# Patient Record
Sex: Female | Born: 1980 | Race: White | Hispanic: Yes | Marital: Married | Smoking: Never smoker
Health system: Southern US, Community
[De-identification: ages and names within clinical notes are randomized; demographics above are authoritative.]

## PROBLEM LIST (undated history)

## (undated) DIAGNOSIS — Z789 Other specified health status: Secondary | ICD-10-CM

## (undated) HISTORY — PX: NO PAST SURGERIES: SHX2092

---

## 2004-03-05 ENCOUNTER — Inpatient Hospital Stay (HOSPITAL_COMMUNITY): Admission: AD | Admit: 2004-03-05 | Discharge: 2004-03-07 | Payer: Self-pay | Admitting: Obstetrics

## 2009-11-21 ENCOUNTER — Encounter: Admission: RE | Admit: 2009-11-21 | Discharge: 2009-11-21 | Payer: Self-pay | Admitting: Family Medicine

## 2010-11-08 ENCOUNTER — Other Ambulatory Visit: Payer: Self-pay | Admitting: Family Medicine

## 2010-11-08 DIAGNOSIS — N644 Mastodynia: Secondary | ICD-10-CM

## 2010-11-13 ENCOUNTER — Ambulatory Visit
Admission: RE | Admit: 2010-11-13 | Discharge: 2010-11-13 | Disposition: A | Discharge status: Home / Self Care | Payer: No Typology Code available for payment source | Source: Ambulatory Visit | Attending: Family Medicine | Admitting: Family Medicine

## 2010-11-13 DIAGNOSIS — N644 Mastodynia: Secondary | ICD-10-CM

## 2011-11-06 LAB — OB RESULTS CONSOLE HEPATITIS B SURFACE ANTIGEN: Hepatitis B Surface Ag: NEGATIVE

## 2011-11-06 LAB — OB RESULTS CONSOLE ABO/RH: RH Type: POSITIVE

## 2011-11-06 LAB — OB RESULTS CONSOLE GC/CHLAMYDIA: Chlamydia: NEGATIVE

## 2011-11-06 LAB — OB RESULTS CONSOLE HIV ANTIBODY (ROUTINE TESTING): HIV: NONREACTIVE

## 2011-11-06 LAB — OB RESULTS CONSOLE RPR: RPR: NONREACTIVE

## 2012-01-23 NOTE — L&D Delivery Note (Signed)
Delivery Note At 4:35 AM a viable female was delivered via Vaginal, Spontaneous Delivery (Presentation: Left Occiput Anterior).  APGAR: , ; weight .   Placenta status: Intact, Spontaneous.  Cord:  with the following complications: .  Cord pH: not done  Anesthesia: None  Episiotomy: None Lacerations: 2nd degree;Perineal Suture Repair: 2.0 vicryl Est. Blood Loss (mL): 250  Mom to postpartum.  Baby to nursery-stable.  MARSHALL,BERNARD A 04/11/2012, 4:52 AM

## 2012-04-11 ENCOUNTER — Inpatient Hospital Stay (HOSPITAL_COMMUNITY)
Admission: AD | Admit: 2012-04-11 | Discharge: 2012-04-13 | DRG: 775 | Disposition: A | Discharge status: Home / Self Care | Payer: Medicaid Other | Source: Ambulatory Visit | Attending: Obstetrics | Admitting: Obstetrics

## 2012-04-11 ENCOUNTER — Encounter (HOSPITAL_COMMUNITY): Payer: Self-pay | Admitting: *Deleted

## 2012-04-11 HISTORY — DX: Other specified health status: Z78.9

## 2012-04-11 LAB — CBC
HCT: 36.4 % (ref 36.0–46.0)
Hemoglobin: 12.6 g/dL (ref 12.0–15.0)
MCV: 93.3 fL (ref 78.0–100.0)
Platelets: 202 10*3/uL (ref 150–400)
RBC: 4.15 MIL/uL (ref 3.87–5.11)
RBC: 3.89 MIL/uL (ref 3.87–5.11)
RDW: 13.5 % (ref 11.5–15.5)
WBC: 8.5 10*3/uL (ref 4.0–10.5)

## 2012-04-11 LAB — TYPE AND SCREEN: Antibody Screen: NEGATIVE

## 2012-04-11 LAB — RPR: RPR Ser Ql: NONREACTIVE

## 2012-04-11 MED ORDER — OXYTOCIN 40 UNITS IN LACTATED RINGERS INFUSION - SIMPLE MED
62.5000 mL/h | INTRAVENOUS | Status: DC
  Filled 2012-04-11: qty 1000

## 2012-04-11 MED ORDER — ZOLPIDEM TARTRATE 5 MG PO TABS: 5.0000 mg | ORAL_TABLET | Freq: Every evening | ORAL | Status: DC | PRN

## 2012-04-11 MED ORDER — LACTATED RINGERS IV SOLN
INTRAVENOUS | Status: DC
  Administered 2012-04-11: 02:00:00 via INTRAVENOUS

## 2012-04-11 MED ORDER — CITRIC ACID-SODIUM CITRATE 334-500 MG/5ML PO SOLN: 30.0000 mL | ORAL | Status: DC | PRN

## 2012-04-11 MED ORDER — ACETAMINOPHEN 325 MG PO TABS: 650.0000 mg | ORAL_TABLET | ORAL | Status: DC | PRN

## 2012-04-11 MED ORDER — FERROUS SULFATE 325 (65 FE) MG PO TABS
325.0000 mg | ORAL_TABLET | Freq: Two times a day (BID) | ORAL | Status: DC
  Administered 2012-04-11 – 2012-04-13 (×5): 325 mg via ORAL
  Filled 2012-04-11 (×5): qty 1

## 2012-04-11 MED ORDER — IBUPROFEN 600 MG PO TABS
600.0000 mg | ORAL_TABLET | Freq: Four times a day (QID) | ORAL | Status: DC
  Administered 2012-04-11 – 2012-04-13 (×8): 600 mg via ORAL
  Filled 2012-04-11 (×7): qty 1

## 2012-04-11 MED ORDER — BUTORPHANOL TARTRATE 1 MG/ML IJ SOLN: 1.0000 mg | INTRAMUSCULAR | Status: DC | PRN

## 2012-04-11 MED ORDER — OXYCODONE-ACETAMINOPHEN 5-325 MG PO TABS: 1.0000 | ORAL_TABLET | ORAL | Status: DC | PRN

## 2012-04-11 MED ORDER — LANOLIN HYDROUS EX OINT: TOPICAL_OINTMENT | CUTANEOUS | Status: DC | PRN

## 2012-04-11 MED ORDER — DIBUCAINE 1 % RE OINT: 1.0000 "application " | TOPICAL_OINTMENT | RECTAL | Status: DC | PRN

## 2012-04-11 MED ORDER — SIMETHICONE 80 MG PO CHEW: 80.0000 mg | CHEWABLE_TABLET | ORAL | Status: DC | PRN

## 2012-04-11 MED ORDER — FLEET ENEMA 7-19 GM/118ML RE ENEM: 1.0000 | ENEMA | RECTAL | Status: DC | PRN

## 2012-04-11 MED ORDER — OXYCODONE-ACETAMINOPHEN 5-325 MG PO TABS
1.0000 | ORAL_TABLET | ORAL | Status: DC | PRN
  Administered 2012-04-11: 1 via ORAL
  Filled 2012-04-11: qty 1

## 2012-04-11 MED ORDER — TETANUS-DIPHTH-ACELL PERTUSSIS 5-2.5-18.5 LF-MCG/0.5 IM SUSP
0.5000 mL | Freq: Once | INTRAMUSCULAR | Status: AC
  Administered 2012-04-12: 0.5 mL via INTRAMUSCULAR
  Filled 2012-04-11: qty 0.5

## 2012-04-11 MED ORDER — DIPHENHYDRAMINE HCL 25 MG PO CAPS: 25.0000 mg | ORAL_CAPSULE | Freq: Four times a day (QID) | ORAL | Status: DC | PRN

## 2012-04-11 MED ORDER — PRENATAL MULTIVITAMIN CH
1.0000 | ORAL_TABLET | Freq: Every day | ORAL | Status: DC
  Administered 2012-04-11 – 2012-04-12 (×2): 1 via ORAL
  Filled 2012-04-11 (×2): qty 1

## 2012-04-11 MED ORDER — ONDANSETRON HCL 4 MG/2ML IJ SOLN: 4.0000 mg | INTRAMUSCULAR | Status: DC | PRN

## 2012-04-11 MED ORDER — OXYTOCIN BOLUS FROM INFUSION
500.0000 mL | INTRAVENOUS | Status: DC
  Administered 2012-04-11: 500 mL via INTRAVENOUS

## 2012-04-11 MED ORDER — WITCH HAZEL-GLYCERIN EX PADS: 1.0000 "application " | MEDICATED_PAD | CUTANEOUS | Status: DC | PRN

## 2012-04-11 MED ORDER — LACTATED RINGERS IV SOLN: 500.0000 mL | INTRAVENOUS | Status: DC | PRN

## 2012-04-11 MED ORDER — IBUPROFEN 600 MG PO TABS
600.0000 mg | ORAL_TABLET | Freq: Four times a day (QID) | ORAL | Status: DC | PRN
  Administered 2012-04-11: 600 mg via ORAL
  Filled 2012-04-11: qty 1

## 2012-04-11 MED ORDER — LIDOCAINE HCL (PF) 1 % IJ SOLN
30.0000 mL | INTRAMUSCULAR | Status: DC | PRN
  Administered 2012-04-11: 30 mL via SUBCUTANEOUS
  Filled 2012-04-11 (×2): qty 30

## 2012-04-11 MED ORDER — ONDANSETRON HCL 4 MG/2ML IJ SOLN: 4.0000 mg | Freq: Four times a day (QID) | INTRAMUSCULAR | Status: DC | PRN

## 2012-04-11 MED ORDER — SENNOSIDES-DOCUSATE SODIUM 8.6-50 MG PO TABS
2.0000 | ORAL_TABLET | Freq: Every day | ORAL | Status: DC
  Administered 2012-04-11 – 2012-04-12 (×2): 2 via ORAL

## 2012-04-11 MED ORDER — BENZOCAINE-MENTHOL 20-0.5 % EX AERO
1.0000 "application " | INHALATION_SPRAY | CUTANEOUS | Status: DC | PRN
  Administered 2012-04-11: 1 via TOPICAL
  Filled 2012-04-11: qty 56

## 2012-04-11 MED ORDER — ONDANSETRON HCL 4 MG PO TABS: 4.0000 mg | ORAL_TABLET | ORAL | Status: DC | PRN

## 2012-04-11 NOTE — MAU Note (Signed)
Pt G3 P2 at 40.4, having contractions every .

## 2012-04-11 NOTE — H&P (Signed)
This is Dr. Francoise Ceo dictating the history and physical on blank blank she's a 32 year old gravida 3 para 2 all to at 39 weeks and 6 days EDC 04/12/2012 negative GBS admitted in labor 5 cm 100% vertex -2 amniotomy performed the fluid was thin meconium contracting regularly Past medical history negative Past surgical history negative Social history negative System review noncontributory HEENT negative Breasts negative Lungs clear to P&A Heart regular rhythm no murmurs no gallops Abdomen term Pelvic as described above Extremities negative

## 2012-04-11 NOTE — Progress Notes (Signed)
UR completed 

## 2012-04-12 NOTE — Progress Notes (Signed)
Post Partum Day 1 Subjective: no complaints  Objective: Blood pressure 94/64, pulse 78, temperature 97.7 F (36.5 C), temperature source Oral, resp. rate 20, height 4\' 10"  (1.473 m), weight 147 lb 9.6 oz (66.951 kg), SpO2 97.00%, unknown if currently breastfeeding.  Physical Exam:  General: alert and no distress Lochia: appropriate Uterine Fundus: firm Incision: None DVT Evaluation: No evidence of DVT seen on physical exam.   Recent Labs  04/11/12 0130 04/11/12 0739  HGB 13.6 12.6  HCT 38.7 36.4    Assessment/Plan: Plan for discharge tomorrow   LOS: 1 day   HARPER,CHARLES A 04/12/2012, 9:12 AM

## 2012-04-13 MED ORDER — OXYCODONE-ACETAMINOPHEN 5-325 MG PO TABS: 1.0000 | ORAL_TABLET | ORAL | Status: AC | PRN

## 2012-04-13 MED ORDER — IBUPROFEN 600 MG PO TABS: 600.0000 mg | ORAL_TABLET | Freq: Four times a day (QID) | ORAL | Status: AC | PRN

## 2012-04-13 NOTE — Progress Notes (Signed)
Post Partum Day 2 Subjective: no complaints  Objective: Blood pressure 94/63, pulse 73, temperature 97.9 F (36.6 C), temperature source Oral, resp. rate 20, height 4\' 10"  (1.473 m), weight 147 lb 9.6 oz (66.951 kg), SpO2 97.00%, unknown if currently breastfeeding.  Physical Exam:  General: alert and no distress Lochia: appropriate Uterine Fundus: firm Incision: None DVT Evaluation: No evidence of DVT seen on physical exam.   Recent Labs  04/11/12 0130 04/11/12 0739  HGB 13.6 12.6  HCT 38.7 36.4    Assessment/Plan: Discharge home   LOS: 2 days   HARPER,CHARLES A 04/13/2012, 5:50 AM

## 2012-04-13 NOTE — Discharge Summary (Signed)
Obstetric Discharge Summary Reason for Admission: onset of labor Prenatal Procedures: ultrasound Intrapartum Procedures: spontaneous vaginal delivery Postpartum Procedures: none Complications-Operative and Postpartum: none Hemoglobin  Date Value Range Status  04/11/2012 12.6  12.0 - 15.0 g/dL Final     HCT  Date Value Range Status  04/11/2012 36.4  36.0 - 46.0 % Final    Physical Exam:  General: alert and no distress Lochia: appropriate Uterine Fundus: firm Incision: none DVT Evaluation: No evidence of DVT seen on physical exam.  Discharge Diagnoses: Term Pregnancy-delivered  Discharge Information: Date: 04/13/2012 Activity: pelvic rest Diet: routine Medications: PNV, Ibuprofen, Colace and Percocet Condition: stable Instructions: refer to practice specific booklet Discharge to: home Follow-up Information   Follow up with MARSHALL,BERNARD A, MD. Schedule an appointment as soon as possible for a visit in 6 weeks.   Contact information:   [address] [place] [address] [phone]       Newborn Data: Live born female  Birth Weight: 7 lb 1.4 oz (3215 g) APGAR: 9, 9  Home with mother.  HARPER,CHARLES A 04/13/2012, 5:55 AM

## 2013-11-23 ENCOUNTER — Encounter (HOSPITAL_COMMUNITY): Payer: Self-pay | Admitting: *Deleted

## 2017-01-09 ENCOUNTER — Other Ambulatory Visit (HOSPITAL_COMMUNITY): Payer: Self-pay | Admitting: *Deleted

## 2017-01-09 DIAGNOSIS — N644 Mastodynia: Secondary | ICD-10-CM

## 2017-01-17 ENCOUNTER — Ambulatory Visit
Admission: RE | Admit: 2017-01-17 | Discharge: 2017-01-17 | Disposition: A | Discharge status: Home / Self Care | Payer: No Typology Code available for payment source | Source: Ambulatory Visit | Attending: Obstetrics and Gynecology | Admitting: Obstetrics and Gynecology

## 2017-01-17 ENCOUNTER — Ambulatory Visit (HOSPITAL_COMMUNITY)
Admission: RE | Admit: 2017-01-17 | Discharge: 2017-01-17 | Disposition: A | Discharge status: Home / Self Care | Payer: Self-pay | Source: Ambulatory Visit | Attending: Obstetrics and Gynecology | Admitting: Obstetrics and Gynecology

## 2017-01-17 ENCOUNTER — Encounter (HOSPITAL_COMMUNITY): Payer: Self-pay

## 2017-01-17 VITALS — BP 118/70 | Temp 98.0°F | Wt 135.8 lb

## 2017-01-17 DIAGNOSIS — N644 Mastodynia: Secondary | ICD-10-CM

## 2017-01-17 DIAGNOSIS — Z1239 Encounter for other screening for malignant neoplasm of breast: Secondary | ICD-10-CM

## 2017-01-17 NOTE — Patient Instructions (Addendum)
Explained breast self awareness with Danielle Gomez. Patient did not need a Pap smear today due to last Pap smear was 05/04/2015. Let her know [facility] will cover Pap smears every 3 years unless has a history of abnormal Pap smears. [facility] and possible left breast ultrasound. Appointment scheduled for Thursday, January 17, 2017 at 1400. Danielle Gomez verbalized understanding.  Leyton Brownlee, Kathaleen Maserhristine Poll, RN 1:46 PM

## 2017-01-17 NOTE — Progress Notes (Signed)
Complaints of left diffuse breast pain x 2 months that comes and goes. Patient rates the pain at a 8 out of 10. Patient states the pain has decreased over the past two weeks now rating the pain at a 2 out of 10. Patient stated she felt a left breast lump one month ago that has resolved.  Pap Smear: Pap smear not completed today. [facility] and normal. Per patient has no history of an abnormal Pap smear. Last Pap smear result will be scanned into Epic.   Physical exam: Breasts Breasts symmetrical. No skin abnormalities bilateral breasts. No nipple retraction bilateral breasts. No nipple discharge bilateral breasts. No lymphadenopathy. No lumps palpated bilateral breasts. Complaints of diffuse left breast tenderness on exam. [facility] for diagnostic mamm[facility] and possible left breast ultrasound. Appointment scheduled for Thursday, January 17, 2017 at 1400.        Pelvic/Bimanual No Pap smear completed today since last Pap smear was 05/04/2015. Pap smear not indicated per BCCCP guidelines.   Smoking History: Patient has never smoked.  Patient Navigation: Patient education provided. [facility]. Spanish interpreter provided.  Used Spanish interpreter [employer] Gal[employer]o from [facility].

## 2017-01-18 ENCOUNTER — Encounter (HOSPITAL_COMMUNITY): Payer: Self-pay | Admitting: *Deleted

## 2019-07-01 ENCOUNTER — Other Ambulatory Visit: Payer: Self-pay

## 2019-07-01 DIAGNOSIS — N644 Mastodynia: Secondary | ICD-10-CM

## 2019-07-01 DIAGNOSIS — N6320 Unspecified lump in the left breast, unspecified quadrant: Secondary | ICD-10-CM

## 2019-07-03 ENCOUNTER — Other Ambulatory Visit: Payer: Self-pay | Admitting: Obstetrics and Gynecology

## 2019-07-03 ENCOUNTER — Other Ambulatory Visit: Payer: Self-pay | Admitting: Primary Care

## 2019-07-03 ENCOUNTER — Other Ambulatory Visit: Payer: Self-pay | Admitting: Psychology

## 2019-07-09 ENCOUNTER — Ambulatory Visit: Payer: Self-pay | Admitting: *Deleted

## 2019-07-09 ENCOUNTER — Other Ambulatory Visit: Payer: Self-pay

## 2019-07-09 VITALS — BP 110/78 | Temp 97.8°F | Wt 141.3 lb

## 2019-07-09 DIAGNOSIS — Z1239 Encounter for other screening for malignant neoplasm of breast: Secondary | ICD-10-CM

## 2019-07-09 DIAGNOSIS — N644 Mastodynia: Secondary | ICD-10-CM

## 2019-07-09 NOTE — Patient Instructions (Signed)
Explained breast self awareness with [place]. Patient did not need a Pap smear today due to last Pap smear and HPV typing was 06/01/2019. Let her know B[facility]CP will cover Pap smears and HPV typing every 5 years unless has a history of abnormal Pap smears. Referred patient to the [facility] for a diagnostic mammogram. Appointment scheduled Tuesday, July 14, 2019 at 1450. Patient aware of appointment and will be there. [place] verbalized understanding.  Dashon Mcintire, Kathaleen Maser, RN 9:26 AM

## 2019-07-09 NOTE — Progress Notes (Addendum)
Ms. Danielle Gomez is a 39 y.o. female who presents to [facility] clinic today with complaint of left outer breast pain and lump x 4 years. Patient stated the pain comes and goes. Patient rates the pain at a 7 out of 10.    Pap Smear: Pap not smear completed today. Last Pap smear was 06/01/2019 at the [facility] clinic and was normal with negative HPV. Per patient has no history of an abnormal Pap smear. Last Pap smear result will be scanned into Epic.    Physical exam: Breasts Right breast slightly larger than left that per patient is normal for her. No skin abnormalities bilateral breasts. No nipple retraction bilateral breasts. No nipple discharge bilateral breasts. No lymphadenopathy. No lumps palpated bilateral breasts. Unable to palpate a lump in patients area of concern. Complaints of left outer breast pain on exam.       Pelvic/Bimanual Pap is not indicated today per BCCCP guidelines.   Smoking History: Patient has never smoked.   Patient Navigation: Patient education provided. Access to services provided for patient through [facility] program. Spanish interpreter Natale Lay from [facility] provided.    Breast and Cervical Cancer Risk Assessment: Patient has no family history of breast cancer, known genetic mutations, or radiation treatment to the chest before age 16. Patient does not have history of cervical dysplasia, immunocompromised, or DES exposure in-utero.  Risk Assessment    Risk Scores      07/09/2019   Last edited by: Narda Rutherford, LPN   5-year risk: 0.2 %   Lifetime risk: 5.9 %         A: BCCCP exam without pap smear.  P: Referred patient to the [facility] for a diagnostic mammogram. Appointment scheduled Tuesday, July 14, 2019 at 1450.  Priscille Heidelberg, RN 07/09/2019 9:26 AM

## 2019-07-14 ENCOUNTER — Ambulatory Visit
Admission: RE | Admit: 2019-07-14 | Discharge: 2019-07-14 | Disposition: A | Discharge status: Home / Self Care | Payer: No Typology Code available for payment source | Source: Ambulatory Visit | Attending: Obstetrics and Gynecology | Admitting: Obstetrics and Gynecology

## 2019-07-14 ENCOUNTER — Ambulatory Visit: Payer: No Typology Code available for payment source

## 2019-07-14 ENCOUNTER — Other Ambulatory Visit: Payer: Self-pay

## 2019-07-14 DIAGNOSIS — N644 Mastodynia: Secondary | ICD-10-CM

## 2019-07-14 DIAGNOSIS — N6320 Unspecified lump in the left breast, unspecified quadrant: Secondary | ICD-10-CM

## 2020-07-13 IMAGING — MG DIGITAL DIAGNOSTIC BILAT W/ TOMO W/ CAD
6 of 10 series · 6 of 30 positions shown · non-contrast
Comparison: Previous exam(s).

CLINICAL DATA: 38-year-old female with a left breast palpable lump
and associated tenderness. The patient was evaluated in 1102 for a
similar problem.

EXAM:
DIGITAL DIAGNOSTIC BILATERAL MAMMOGRAM WITH CAD AND TOMO
ULTRASOUND LEFT BREAST

[R MLO synth-2D]
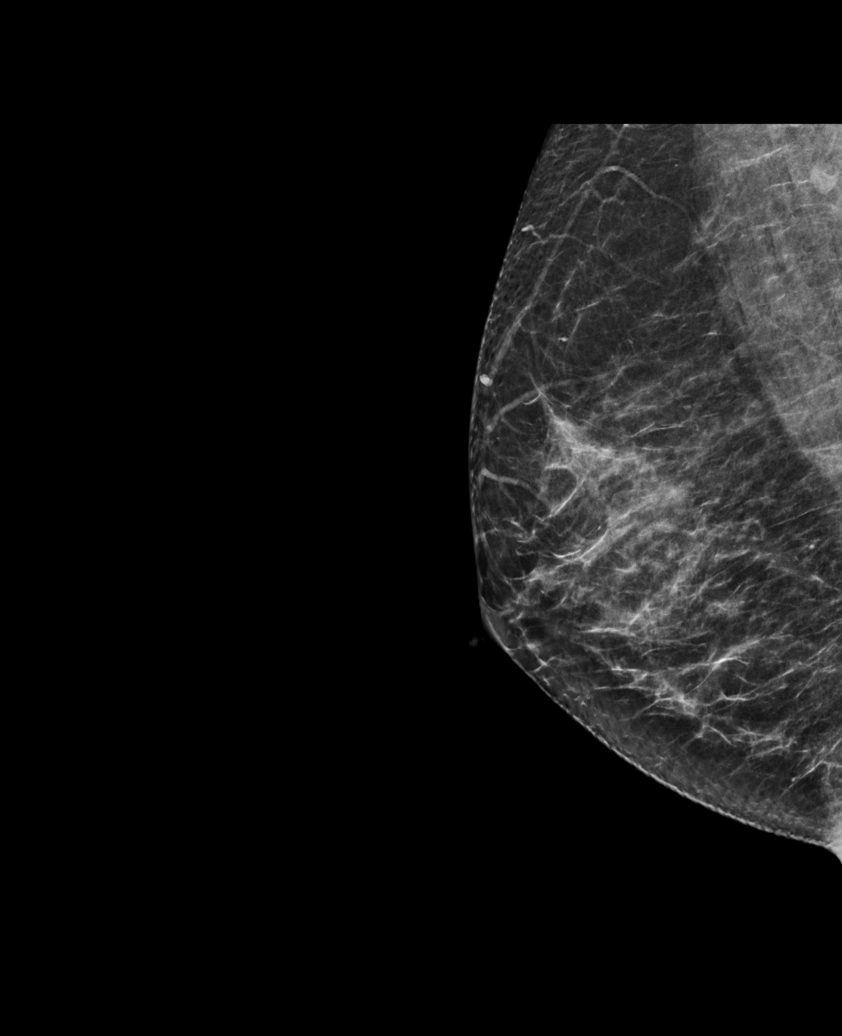

[L MLO synth-2D]
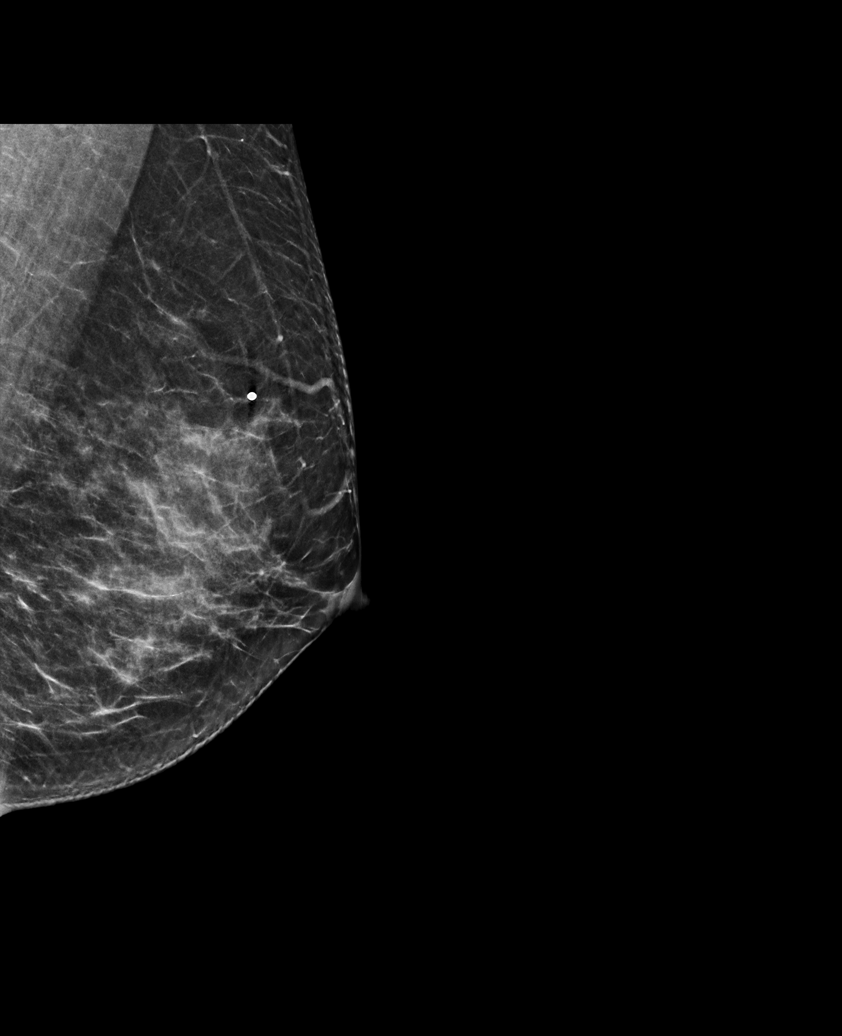

[L TAN synth-2D]
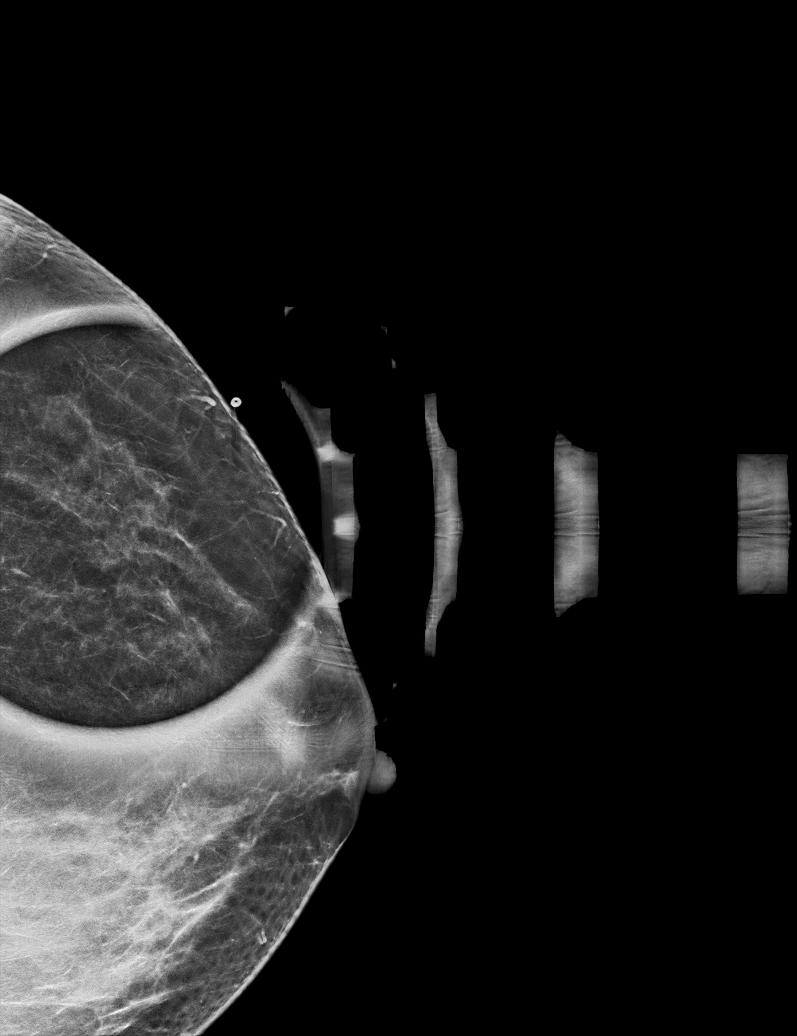

[L CC synth-2D]
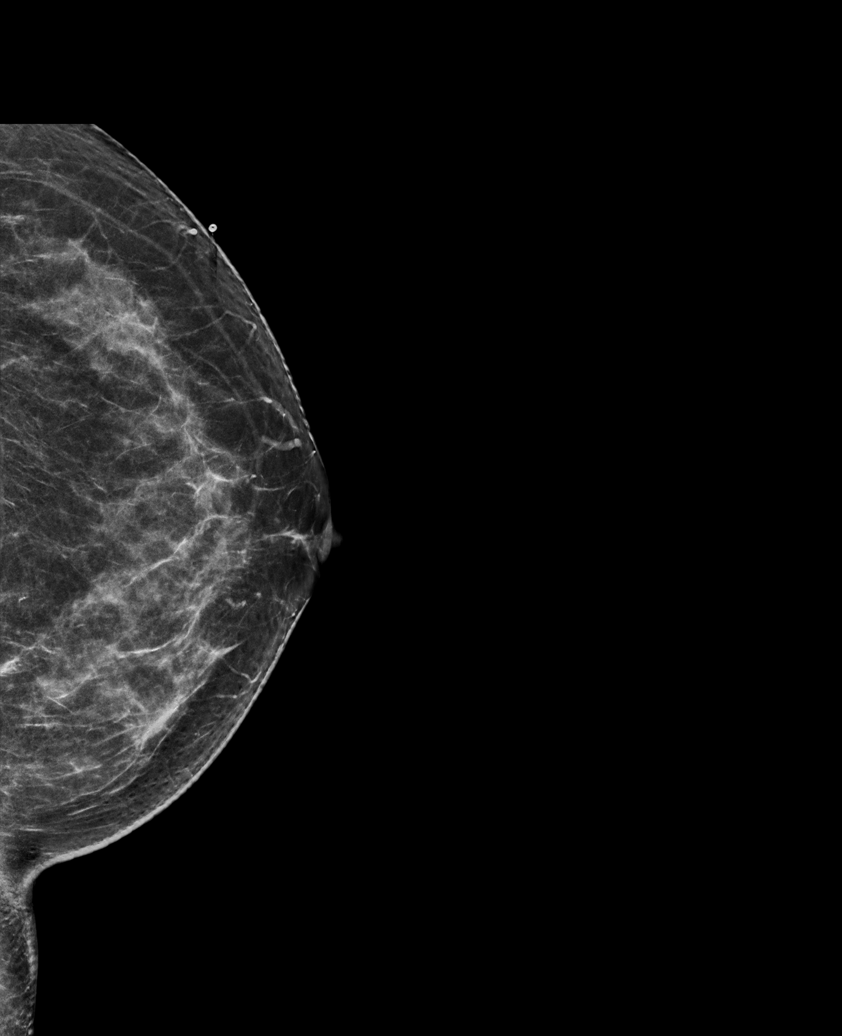

[R CC synth-2D]
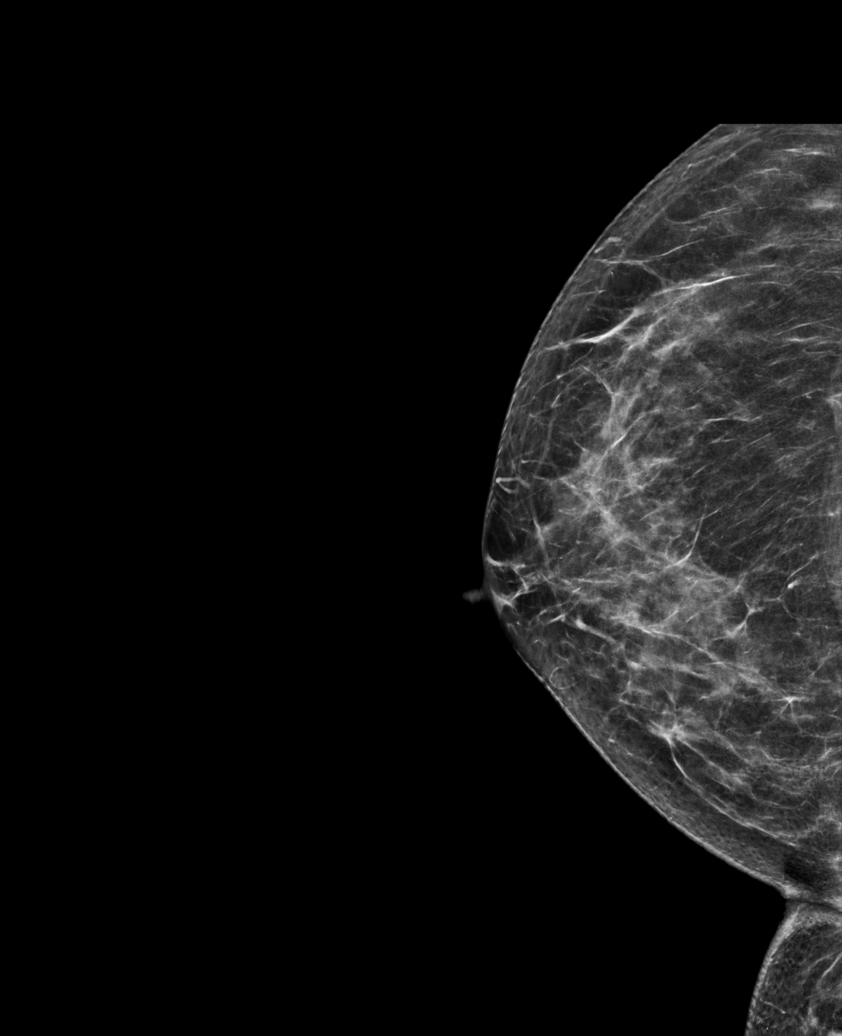

[L CC tomo · tomo slice 35/68.0]
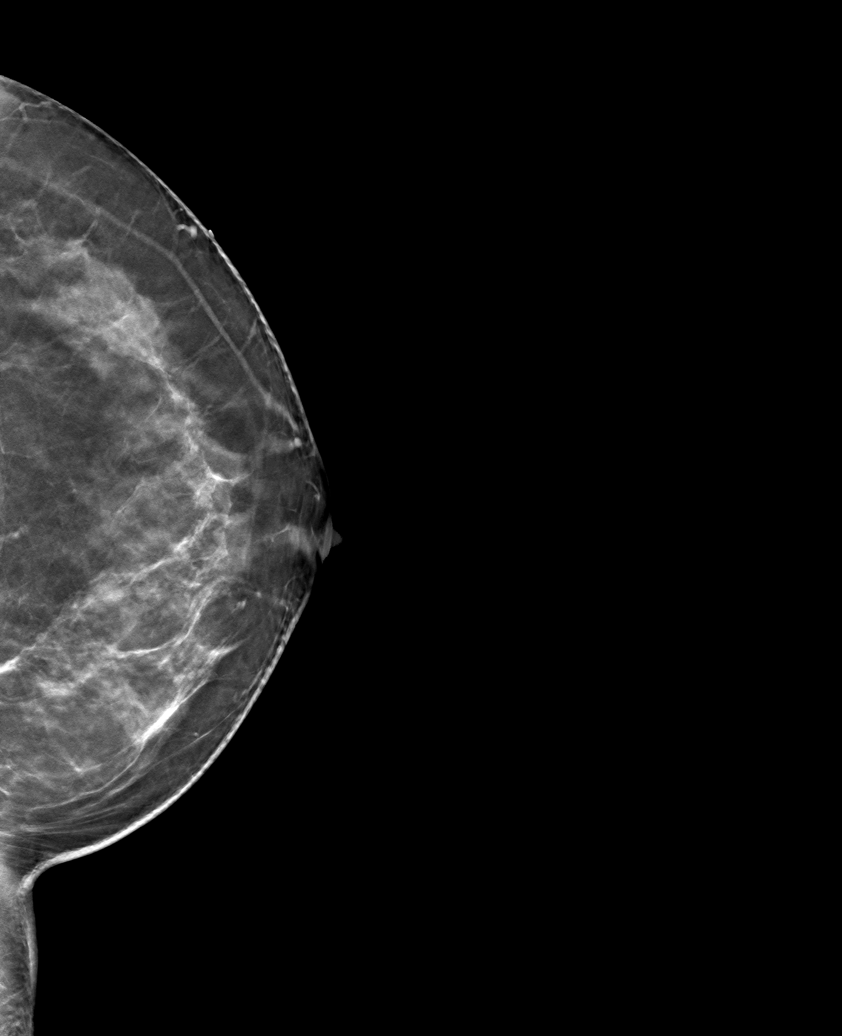

[6 of 30 positions shown; findings below may reference images not displayed]

ACR Breast Density Category c: The breast tissue is heterogeneously
dense, which may obscure small masses.
FINDINGS: A radiopaque BB was placed at the site of the patient's palpable
lump in the upper-outer left breast. No focal or suspicious findings
are seen deep to the radiopaque BB or within the remainder of either
breast.

Mammographic images were processed with CAD.

Targeted ultrasound is performed, showing normal fibroglandular
tissue without focal or suspicious sonographic abnormality
corresponding with the patient's palpable lump.
IMPRESSION: 1. No mammographic evidence of malignancy in either breast.
2. No suspicious sonographic findings at the site of the patient's
focal left breast lump.

RECOMMENDATION:
1. Clinical follow-up recommended for the palpable/painful area of
concern in the left breast. Any further workup should be based on
clinical grounds.
2. Screening mammogram at age 41 unless there are persistent or
intervening clinical concerns. (Code:[id number])

I have discussed the findings and recommendations with the patient.
If applicable, a reminder letter will be sent to the patient
regarding the next appointment.

BI-RADS CATEGORY  1: Negative.

## 2022-10-23 ENCOUNTER — Telehealth: Payer: Self-pay

## 2022-10-23 NOTE — Telephone Encounter (Signed)
Telephoned patient using interpreter, Orie Fisherman. Left voice message with [facility] (scholarship) contact information.

## 2023-12-11 ENCOUNTER — Other Ambulatory Visit: Payer: Self-pay | Admitting: Obstetrics & Gynecology

## 2023-12-11 DIAGNOSIS — Z1231 Encounter for screening mammogram for malignant neoplasm of breast: Secondary | ICD-10-CM

## 2024-01-02 ENCOUNTER — Ambulatory Visit
Admission: RE | Admit: 2024-01-02 | Discharge: 2024-01-02 | Disposition: A | Discharge status: Home / Self Care | Source: Ambulatory Visit | Attending: Obstetrics & Gynecology | Admitting: Obstetrics & Gynecology

## 2024-01-02 DIAGNOSIS — Z1231 Encounter for screening mammogram for malignant neoplasm of breast: Secondary | ICD-10-CM
